# Patient Record
Sex: Male | Born: 1980 | Race: White | Hispanic: No | Marital: Single | Smoking: Current every day smoker
Health system: Southern US, Community
[De-identification: ages and names within clinical notes are randomized; demographics above are authoritative.]

---

## 2013-12-22 ENCOUNTER — Encounter (HOSPITAL_BASED_OUTPATIENT_CLINIC_OR_DEPARTMENT_OTHER): Payer: BC Managed Care – PPO

## 2014-01-05 ENCOUNTER — Encounter (HOSPITAL_BASED_OUTPATIENT_CLINIC_OR_DEPARTMENT_OTHER): Payer: BC Managed Care – PPO

## 2014-02-04 ENCOUNTER — Ambulatory Visit (HOSPITAL_BASED_OUTPATIENT_CLINIC_OR_DEPARTMENT_OTHER): Payer: BC Managed Care – PPO | Attending: Otolaryngology | Admitting: Sleep Medicine

## 2014-02-04 VITALS — Ht 61.2 in | Wt 168.0 lb

## 2014-02-04 DIAGNOSIS — G4710 Hypersomnia, unspecified: Secondary | ICD-10-CM | POA: Insufficient documentation

## 2014-02-04 DIAGNOSIS — G4730 Sleep apnea, unspecified: Secondary | ICD-10-CM

## 2014-02-07 DIAGNOSIS — G4730 Sleep apnea, unspecified: Secondary | ICD-10-CM

## 2014-02-07 NOTE — Sleep Study (Signed)
   NAME: Rickey GuileHugh Tashiro IV DATE OF BIRTH:  02/09/1981 MEDICAL RECORD NUMBER [id number]  LOCATION: [facility]  PHYSICIAN: Milo Solana D  DATE OF STUDY: 02/04/2014  SLEEP STUDY TYPE: Nocturnal Polysomnogram               REFERRING PHYSICIAN: Dillard CannonNewman, Christopher E, *  INDICATION FOR STUDY: Hypersomnia with sleep apnea  EPWORTH SLEEPINESS SCORE:   2/24 HEIGHT: 5' 1.2" (155.4 cm)  WEIGHT: 168 lb (76.204 kg)    Body mass index is 31.56 kg/(m^2).  NECK SIZE: 15 in.  MEDICATIONS: Charted for review  SLEEP ARCHITECTURE: Total sleep time 312.5 minutes with sleep efficiency 75.2%. Stage I was 6.4%, stage II 55.7%, stage III 22.2%, REM 15.7% of total sleep time. Sleep latency 76.5 minutes, REM latency 74.5 minutes, awake after sleep onset 26 minutes, arousal index 17.1. Bedtime medication: None  RESPIRATORY DATA: Apnea hypoxia index (AHI) 4.6 per hour. 24 total events scored including 7 obstructive apneas, 1 central apnea, 3 mixed apneas, 13 hypopneas. Most events were associated with supine sleep position. REM AHI of 4.9 per hour. There were not enough events to permit split protocol CPAP titration.  OXYGEN DATA: Loud snoring with oxygen desaturation to a nadir of 92% and mean oxygen saturation through the study of 96.4% on room air.  CARDIAC DATA: Sinus rhythm with occasional PAC  MOVEMENT/PARASOMNIA: No significant motor disturbance, no bathroom trips  IMPRESSION/ RECOMMENDATION:   1) Occasional respiratory events with sleep disturbance, within normal limits. AHI 4.6 per hour (the normal range for adults is an AHI from 0-5 events per hour). Loud snoring with oxygen desaturation to a nadir of 92% and mean oxygen saturation through the study of 96.4% on room air.   Signed Jetty Duhamellinton Ramone Gander M.D. Waymon BudgeYOUNG,Adaleen Hulgan D Diplomate, American Board of Sleep Medicine  ELECTRONICALLY SIGNED ON:  02/07/2014, 12:59 PM  [facility] PH: [phone]   FX: [phone]  [phone] ACCREDITED BY THE AMERICAN ACADEMY OF SLEEP MEDICINE

## 2014-09-22 ENCOUNTER — Emergency Department (HOSPITAL_COMMUNITY)
Admission: EM | Admit: 2014-09-22 | Discharge: 2014-09-22 | Discharge status: Absent without leave | Payer: BC Managed Care – PPO | Source: Home / Self Care

## 2014-10-02 ENCOUNTER — Encounter (HOSPITAL_COMMUNITY): Payer: Self-pay | Admitting: *Deleted

## 2014-10-02 ENCOUNTER — Emergency Department (HOSPITAL_COMMUNITY)
Admission: EM | Admit: 2014-10-02 | Discharge: 2014-10-02 | Disposition: A | Discharge status: Home / Self Care | Payer: BC Managed Care – PPO | Source: Home / Self Care | Attending: Emergency Medicine | Admitting: Emergency Medicine

## 2014-10-02 ENCOUNTER — Ambulatory Visit (HOSPITAL_COMMUNITY): Payer: BC Managed Care – PPO | Attending: Emergency Medicine

## 2014-10-02 DIAGNOSIS — R0602 Shortness of breath: Secondary | ICD-10-CM | POA: Diagnosis present

## 2014-10-02 DIAGNOSIS — R599 Enlarged lymph nodes, unspecified: Secondary | ICD-10-CM | POA: Insufficient documentation

## 2014-10-02 DIAGNOSIS — R590 Localized enlarged lymph nodes: Secondary | ICD-10-CM

## 2014-10-02 LAB — COMPREHENSIVE METABOLIC PANEL
ALBUMIN: 4.1 g/dL (ref 3.5–5.2)
ALK PHOS: 55 U/L (ref 39–117)
ALT: 25 U/L (ref 0–53)
ANION GAP: 12 (ref 5–15)
AST: 17 U/L (ref 0–37)
BUN: 10 mg/dL (ref 6–23)
CO2: 24 mEq/L (ref 19–32)
Calcium: 9 mg/dL (ref 8.4–10.5)
Chloride: 106 mEq/L (ref 96–112)
Creatinine, Ser: 0.68 mg/dL (ref 0.50–1.35)
GFR calc Af Amer: >90 mL/min (ref >90)
GFR calc non Af Amer: >90 mL/min (ref >90)
Glucose, Bld: 100 mg/dL — ABNORMAL HIGH (ref 70–99)
Potassium: 3.9 mEq/L (ref 3.7–5.3)
Sodium: 142 mEq/L (ref 137–147)
Total Bilirubin: 0.6 mg/dL (ref 0.3–1.2)
Total Protein: 6.5 g/dL (ref 6.0–8.3)

## 2014-10-02 LAB — CBC WITH DIFFERENTIAL/PLATELET
BASOS ABS: 0.1 10*3/uL (ref 0.0–0.1)
BASOS PCT: 1 % (ref 0–1)
EOS ABS: 0.1 10*3/uL (ref 0.0–0.7)
Eosinophils Relative: 2 % (ref 0–5)
HCT: 39.2 % (ref 39.0–52.0)
Hemoglobin: 13.6 g/dL (ref 13.0–17.0)
Lymphocytes Relative: 41 % (ref 12–46)
Lymphs Abs: 3 10*3/uL (ref 0.7–4.0)
MCH: 29.5 pg (ref 26.0–34.0)
MCHC: 34.7 g/dL (ref 30.0–36.0)
MCV: 85 fL (ref 78.0–100.0)
Monocytes Absolute: 0.5 10*3/uL (ref 0.1–1.0)
Monocytes Relative: 7 % (ref 3–12)
Neutro Abs: 3.7 10*3/uL (ref 1.7–7.7)
Neutrophils Relative %: 49 % (ref 43–77)
PLATELETS: 207 10*3/uL (ref 150–400)
RBC: 4.61 MIL/uL (ref 4.22–5.81)
RDW: 12.9 % (ref 11.5–15.5)
WBC: 7.3 10*3/uL (ref 4.0–10.5)

## 2014-10-02 LAB — POCT INFECTIOUS MONO SCREEN: Mono Screen: NEGATIVE

## 2014-10-02 MED ORDER — CETIRIZINE HCL 10 MG PO TABS: 10.0000 mg | ORAL_TABLET | Freq: Every day | ORAL | Status: AC

## 2014-10-02 NOTE — ED Notes (Signed)
Pt  Reports   sev  Months  Of   Swollen  Lymph  node throat       Reports   As  Well intermittant  Episodes of fatigue        And  Headaches     He is  Sitting  Upright on   Exam table appearing in no  Acute  distress

## 2014-10-02 NOTE — ED Provider Notes (Signed)
CSN: [id number]     Arrival date & time 10/02/14  0820 History   First MD Initiated Contact with Patient 10/02/14 [phone]     Chief Complaint  Patient presents with  . Lymphadenopathy   (Consider location/radiation/quality/duration/timing/severity/associated sxs/prior Treatment) HPI  He is a healthy 33 year old man here for evaluation of lymphadenopathy. He states since the end of September, he has had intermittently swollen lymph nodes in his neck. They are not particularly tender. The do fluctuate in size. He denies any runny nose or cough. He denies any history of allergies. He denies any sore throat, although does report some throat discomfort which he attributes to the size of the lymph nodes. He denies any fevers, chills, night sweats, weight loss. No nausea or vomiting. No abdominal pain. He does report some intermittent headaches, intermittent shortness of breath, intermittent ear stuffiness/pain. He does report some fatigue.  History reviewed. No pertinent past medical history. History reviewed. No pertinent past surgical history. No family history on file. History  Substance Use Topics  . Smoking status: Current Every Day Smoker  . Smokeless tobacco: Not on file  . Alcohol Use: No    Review of Systems  Constitutional: Positive for fatigue. Negative for fever, chills and unexpected weight change.  HENT: Positive for ear pain. Negative for congestion, rhinorrhea, sinus pressure, sore throat and trouble swallowing.   Respiratory: Positive for shortness of breath. Negative for cough.   Cardiovascular: Negative for chest pain.  Gastrointestinal: Negative for nausea, vomiting and abdominal pain.  Neurological: Positive for headaches. Negative for dizziness.  Hematological: Positive for adenopathy.    Allergies  Review of patient's allergies indicates no known allergies.  Home Medications   Prior to Admission medications   Medication Sig Start Date End Date Taking? Authorizing  Provider  cetirizine (ZYRTEC) 10 MG tablet Take 1 tablet (10 mg total) by mouth daily. 10/02/14   Charm RingsErin J Coyle Stordahl, MD   BP 121/73 mmHg  Pulse 50  Temp(Src) 98 F (36.7 C) (Oral)  Resp 18  SpO2 98% Physical Exam  Constitutional: He is oriented to person, place, and time. He appears well-developed and well-nourished. No distress.  Somewhat anxious appearing  HENT:  Head: Normocephalic and atraumatic.  Right Ear: External ear normal. Tympanic membrane is retracted (mild).  Left Ear: External ear normal. Tympanic membrane is retracted (mild).  Nose: Nose normal.  Mouth/Throat: Oropharynx is clear and moist. No oropharyngeal exudate.  Neck: Neck supple.  Cardiovascular: Normal rate, regular rhythm and normal heart sounds.   No murmur heard. Pulmonary/Chest: Effort normal and breath sounds normal. No respiratory distress. He has no wheezes. He has no rales.  Abdominal: Soft. Bowel sounds are normal. He exhibits no distension. There is no tenderness. There is no rebound and no guarding.  No hepatosplenomegaly  Lymphadenopathy:       Head (right side): Submandibular adenopathy present.       Head (left side): Submandibular adenopathy present.    He has no cervical adenopathy.    He has no axillary adenopathy.       Right: No supraclavicular adenopathy present.       Left: No supraclavicular adenopathy present.  Neurological: He is alert and oriented to person, place, and time.  Skin: Skin is warm and dry.    ED Course  Procedures (including critical care time) Labs Review Labs Reviewed  COMPREHENSIVE METABOLIC PANEL - Abnormal; Notable for the following:    Glucose, Bld 100 (*)    All other components within normal  limits  CBC WITH DIFFERENTIAL  POCT INFECTIOUS MONO SCREEN    Imaging Review Dg Chest 2 View  10/02/2014   CLINICAL DATA:  Shortness of breath.  Adenopathy in the neck.  EXAM: CHEST  2 VIEW  COMPARISON:  None.  FINDINGS: The heart size and pulmonary vascularity are  normal. There is slight blunting of the left costovertebral angle laterally which I suspect is chronic. The lungs are clear. No osseous abnormality.  IMPRESSION: Slight blunting of left costophrenic angle, most likely chronic. Otherwise, normal exam.   Electronically Signed   By: Geanie CooleyJim  Maxwell M.D.   On: 10/02/2014 09:55     MDM   1. Submandibular lymphadenopathy   2. Shortness of breath   3. Shortness of breath    He has some mild submandibular lymphadenopathy. He is CBC and CMP did not show any signs of lymphoma or other serious etiology. I suspect his lymphadenopathy is due to low-level virus exposure versus allergies versus CMV mono. Recommended treatment with daily Zyrtec. Discussed that lymph nodes can take months to resolve. Follow-up as needed.    Charm RingsErin J Johan Antonacci, MD 10/02/14 [phone]

## 2014-10-02 NOTE — Discharge Instructions (Signed)
Your lymph nodes are likely enlarge due to either allergies or low level virus exposures. Your blood work and chest x-ray were all normal. Take Zyrtec 1 pill daily for the next 3 months. Follow up if you develop fevers, night sweats, or weight loss.

## 2015-09-09 ENCOUNTER — Emergency Department (HOSPITAL_COMMUNITY)
Admission: EM | Admit: 2015-09-09 | Discharge: 2015-09-09 | Disposition: A | Discharge status: Home / Self Care | Payer: BLUE CROSS/BLUE SHIELD | Source: Home / Self Care | Attending: Emergency Medicine | Admitting: Emergency Medicine

## 2015-09-09 ENCOUNTER — Encounter (HOSPITAL_COMMUNITY): Payer: Self-pay | Admitting: Emergency Medicine

## 2015-09-09 DIAGNOSIS — J069 Acute upper respiratory infection, unspecified: Secondary | ICD-10-CM

## 2015-09-09 NOTE — ED Notes (Signed)
Cough and cold, onset Sunday 10/23

## 2015-09-09 NOTE — ED Provider Notes (Signed)
CSN: [id number]     Arrival date & time 09/09/15  1736 History   First MD Initiated Contact with Patient 09/09/15 1857     Chief Complaint  Patient presents with  . URI   (Consider location/radiation/quality/duration/timing/severity/associated sxs/prior Treatment) HPI Comments: 34 year old male states that 4 days ago he had a sore throat, headache and a feeling of 2 lymph nodes in his neck that were sore. Today he states his throat is more scratchy than anything. 2 days ago he states that he developed a runny nose, sneezing, nose stopped, chest tightness, cough, PND and ears feeling stopped up and uncomfortable. Sometimes he feels hot then cold.   History reviewed. No pertinent past medical history. History reviewed. No pertinent past surgical history. No family history on file. Social History  Substance Use Topics  . Smoking status: Current Every Day Smoker  . Smokeless tobacco: None  . Alcohol Use: No    Review of Systems  Constitutional: Positive for activity change. Negative for fever, diaphoresis and fatigue.  HENT: Positive for congestion, postnasal drip, sneezing, sore throat and trouble swallowing. Negative for ear pain, facial swelling and rhinorrhea.   Eyes: Negative for pain, discharge and redness.  Respiratory: Positive for cough. Negative for chest tightness and shortness of breath.   Cardiovascular: Negative.   Gastrointestinal: Negative.   Musculoskeletal: Negative.  Negative for neck pain and neck stiffness.  Neurological: Negative.     Allergies  Review of patient's allergies indicates no known allergies.  Home Medications   Prior to Admission medications   Medication Sig Start Date End Date Taking? Authorizing Provider  cetirizine (ZYRTEC) 10 MG tablet Take 1 tablet (10 mg total) by mouth daily. 10/02/14   Charm RingsErin J Honig, MD   Meds Ordered and Administered this Visit  Medications - No data to display  BP 112/63 mmHg  Pulse 63  Temp(Src) 98.6 F (37 C)  (Oral)  Resp 16  SpO2 99% No data found.   Physical Exam  Constitutional: He is oriented to person, place, and time. He appears well-developed and well-nourished. No distress.  HENT:  Bilateral TMs are retracted with minor erythema. No bulging or effusion is seen. Oropharynx with minor erythema, no swelling, exudates.  Eyes: EOM are normal.  Neck: Normal range of motion. Neck supple.  Cardiovascular: Normal rate, regular rhythm and normal heart sounds.   Pulmonary/Chest: Effort normal. No respiratory distress. He has wheezes.  Musculoskeletal: Normal range of motion. He exhibits no edema.  Lymphadenopathy:    He has no cervical adenopathy.  Neurological: He is alert and oriented to person, place, and time. He exhibits normal muscle tone.  Skin: Skin is warm and dry. No rash noted.  Psychiatric: He has a normal mood and affect.  Nursing note and vitals reviewed.   ED Course  Procedures (including critical care time)  Labs Review Labs Reviewed - No data to display  Imaging Review No results found.   Visual Acuity Review  Right Eye Distance:   Left Eye Distance:   Bilateral Distance:    Right Eye Near:   Left Eye Near:    Bilateral Near:         MDM   1. URI (upper respiratory infection)    Drink plenty of fluids and stay well-hydrated. Rest as much he can May take the over-the-counter medications we discussed that contained in any histamines and decongest. Dextromethorphan is good for cough. Cepacol lozenges for sore throat and ibuprofen for discomfort aches and pains or fever.  Hayden Rasmussen, NP 09/09/15 [phone]

## 2015-09-09 NOTE — Discharge Instructions (Signed)
Upper Respiratory Infection, Adult Drink plenty of fluids and stay well-hydrated. Rest as much he can May take the over-the-counter medications we discussed that contained in any histamines and decongest. Dextromethorphan is good for cough. Cepacol lozenges for sore throat and ibuprofen for discomfort aches and pains or fever. Most upper respiratory infections (URIs) are a viral infection of the air passages leading to the lungs. A URI affects the nose, throat, and upper air passages. The most common type of URI is nasopharyngitis and is typically referred to as "the common cold." URIs run their course and usually go away on their own. Most of the time, a URI does not require medical attention, but sometimes a bacterial infection in the upper airways can follow a viral infection. This is called a secondary infection. Sinus and middle ear infections are common types of secondary upper respiratory infections. Bacterial pneumonia can also complicate a URI. A URI can worsen asthma and chronic obstructive pulmonary disease (COPD). Sometimes, these complications can require emergency medical care and may be life threatening.  CAUSES Almost all URIs are caused by viruses. A virus is a type of germ and can spread from one person to another.  RISKS FACTORS You may be at risk for a URI if:   You smoke.   You have chronic heart or lung disease.  You have a weakened defense (immune) system.   You are very young or very old.   You have nasal allergies or asthma.  You work in crowded or poorly ventilated areas.  You work in health care facilities or schools. SIGNS AND SYMPTOMS  Symptoms typically develop 2-3 days after you come in contact with a cold virus. Most viral URIs last 7-10 days. However, viral URIs from the influenza virus (flu virus) can last 14-18 days and are typically more severe. Symptoms may include:   Runny or stuffy (congested) nose.   Sneezing.   Cough.   Sore throat.    Headache.   Fatigue.   Fever.   Loss of appetite.   Pain in your forehead, behind your eyes, and over your cheekbones (sinus pain).  Muscle aches.  DIAGNOSIS  Your health care provider may diagnose a URI by:  Physical exam.  Tests to check that your symptoms are not due to another condition such as:  Strep throat.  Sinusitis.  Pneumonia.  Asthma. TREATMENT  A URI goes away on its own with time. It cannot be cured with medicines, but medicines may be prescribed or recommended to relieve symptoms. Medicines may help:  Reduce your fever.  Reduce your cough.  Relieve nasal congestion. HOME CARE INSTRUCTIONS   Take medicines only as directed by your health care provider.   Gargle warm saltwater or take cough drops to comfort your throat as directed by your health care provider.  Use a warm mist humidifier or inhale steam from a shower to increase air moisture. This may make it easier to breathe.  Drink enough fluid to keep your urine clear or pale yellow.   Eat soups and other clear broths and maintain good nutrition.   Rest as needed.   Return to work when your temperature has returned to normal or as your health care provider advises. You may need to stay home longer to avoid infecting others. You can also use a face mask and careful hand washing to prevent spread of the virus.  Increase the usage of your inhaler if you have asthma.   Do not use any tobacco products,  including cigarettes, chewing tobacco, or electronic cigarettes. If you need help quitting, ask your health care provider. PREVENTION  The best way to protect yourself from getting a cold is to practice good hygiene.   Avoid oral or hand contact with people with cold symptoms.   Wash your hands often if contact occurs.  There is no clear evidence that vitamin C, vitamin E, echinacea, or exercise reduces the chance of developing a cold. However, it is always recommended to get plenty  of rest, exercise, and practice good nutrition.  SEEK MEDICAL CARE IF:   You are getting worse rather than better.   Your symptoms are not controlled by medicine.   You have chills.  You have worsening shortness of breath.  You have brown or red mucus.  You have yellow or brown nasal discharge.  You have pain in your face, especially when you bend forward.  You have a fever.  You have swollen neck glands.  You have pain while swallowing.  You have white areas in the back of your throat. SEEK IMMEDIATE MEDICAL CARE IF:   You have severe or persistent:  Headache.  Ear pain.  Sinus pain.  Chest pain.  You have chronic lung disease and any of the following:  Wheezing.  Prolonged cough.  Coughing up blood.  A change in your usual mucus.  You have a stiff neck.  You have changes in your:  Vision.  Hearing.  Thinking.  Mood. MAKE SURE YOU:   Understand these instructions.  Will watch your condition.  Will get help right away if you are not doing well or get worse.   This information is not intended to replace advice given to you by your health care provider. Make sure you discuss any questions you have with your health care provider.   Document Released: 04/25/2001 Document Revised: 03/16/2015 Document Reviewed: 02/04/2014 Elsevier Interactive Patient Education [employer]

## 2015-10-30 IMAGING — CR DG CHEST 2V
2 series · 2 of 2 positions shown · non-contrast
Comparison: None.

CLINICAL DATA: Shortness of breath.  Adenopathy in the neck.

EXAM:
CHEST  2 VIEW

[w chest pa]
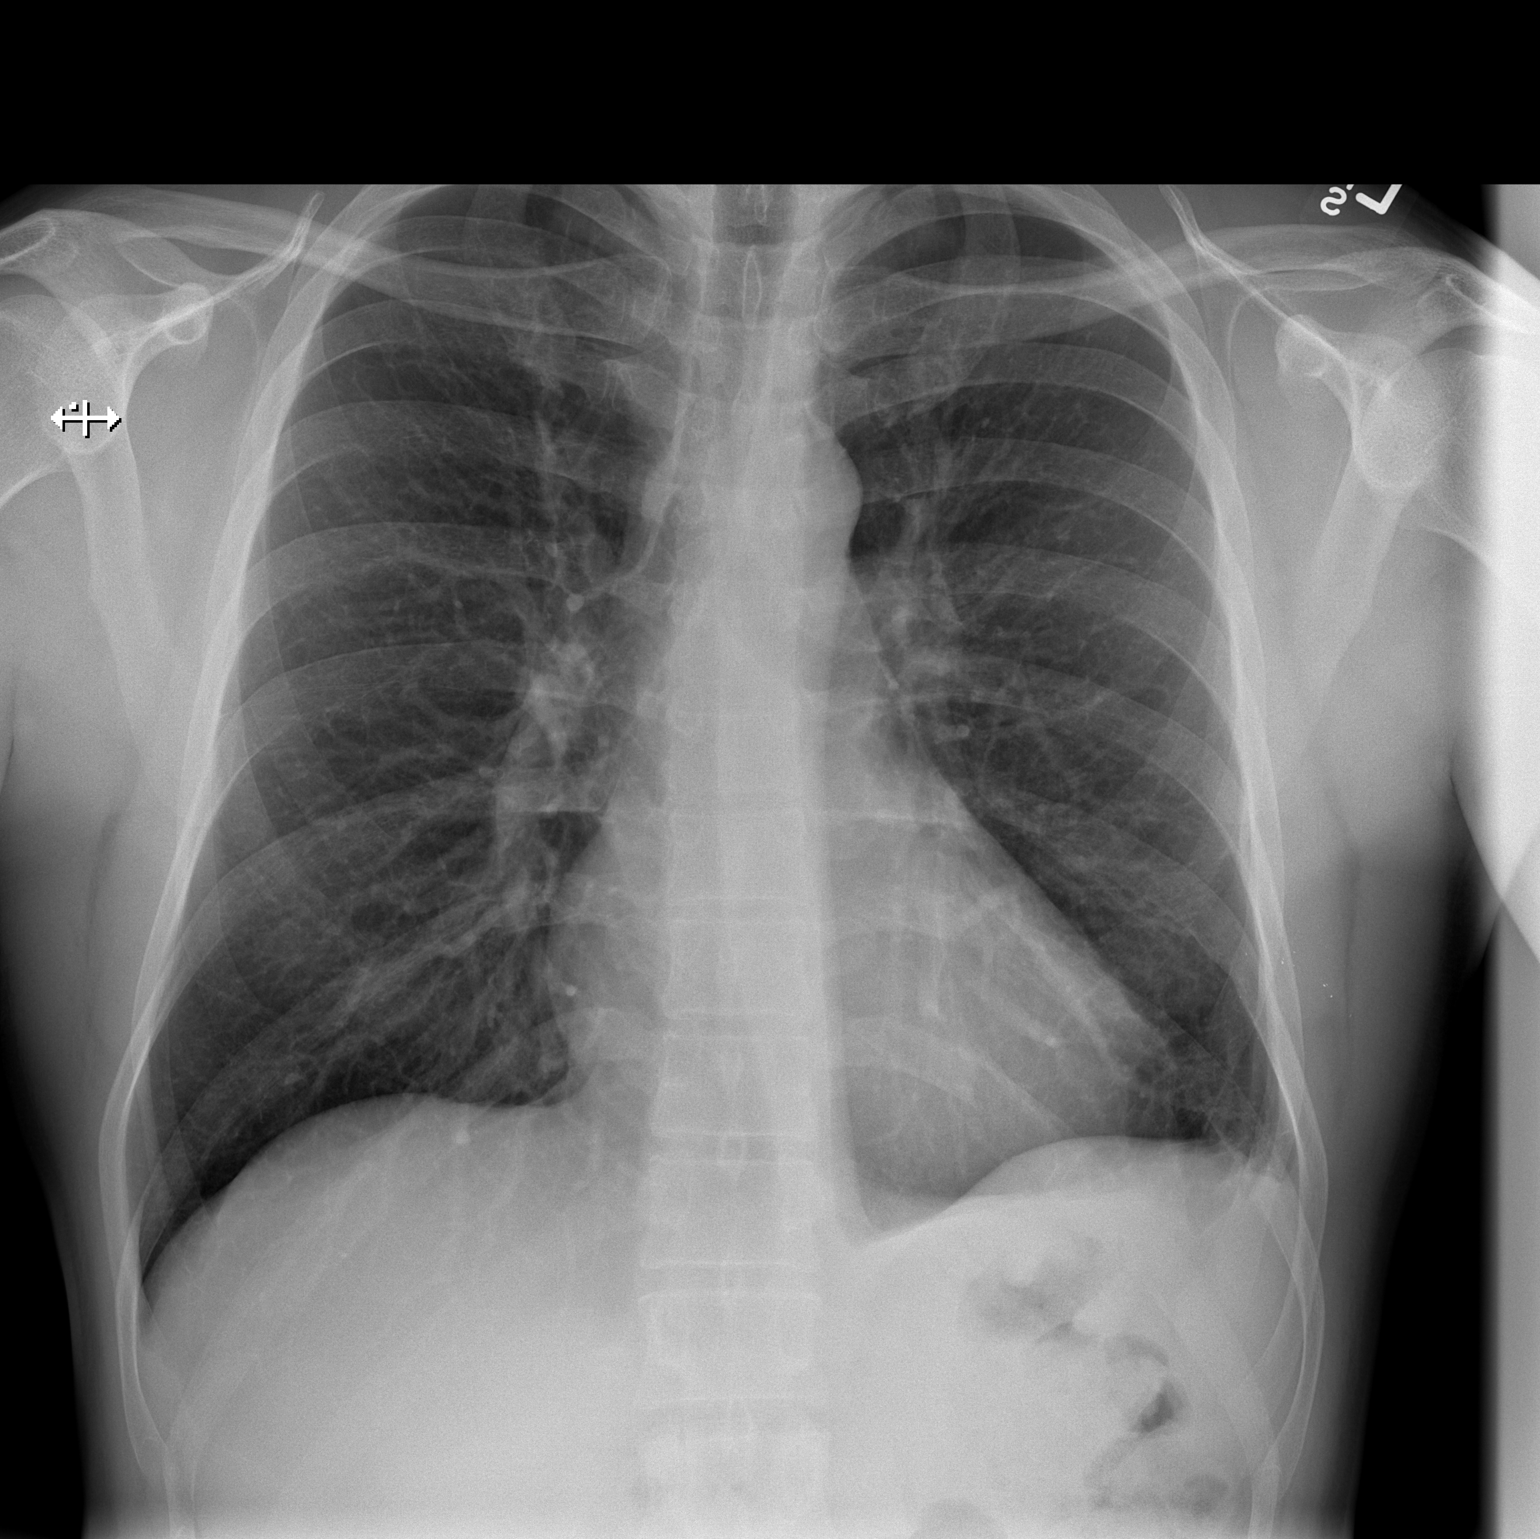

[w chest lat]
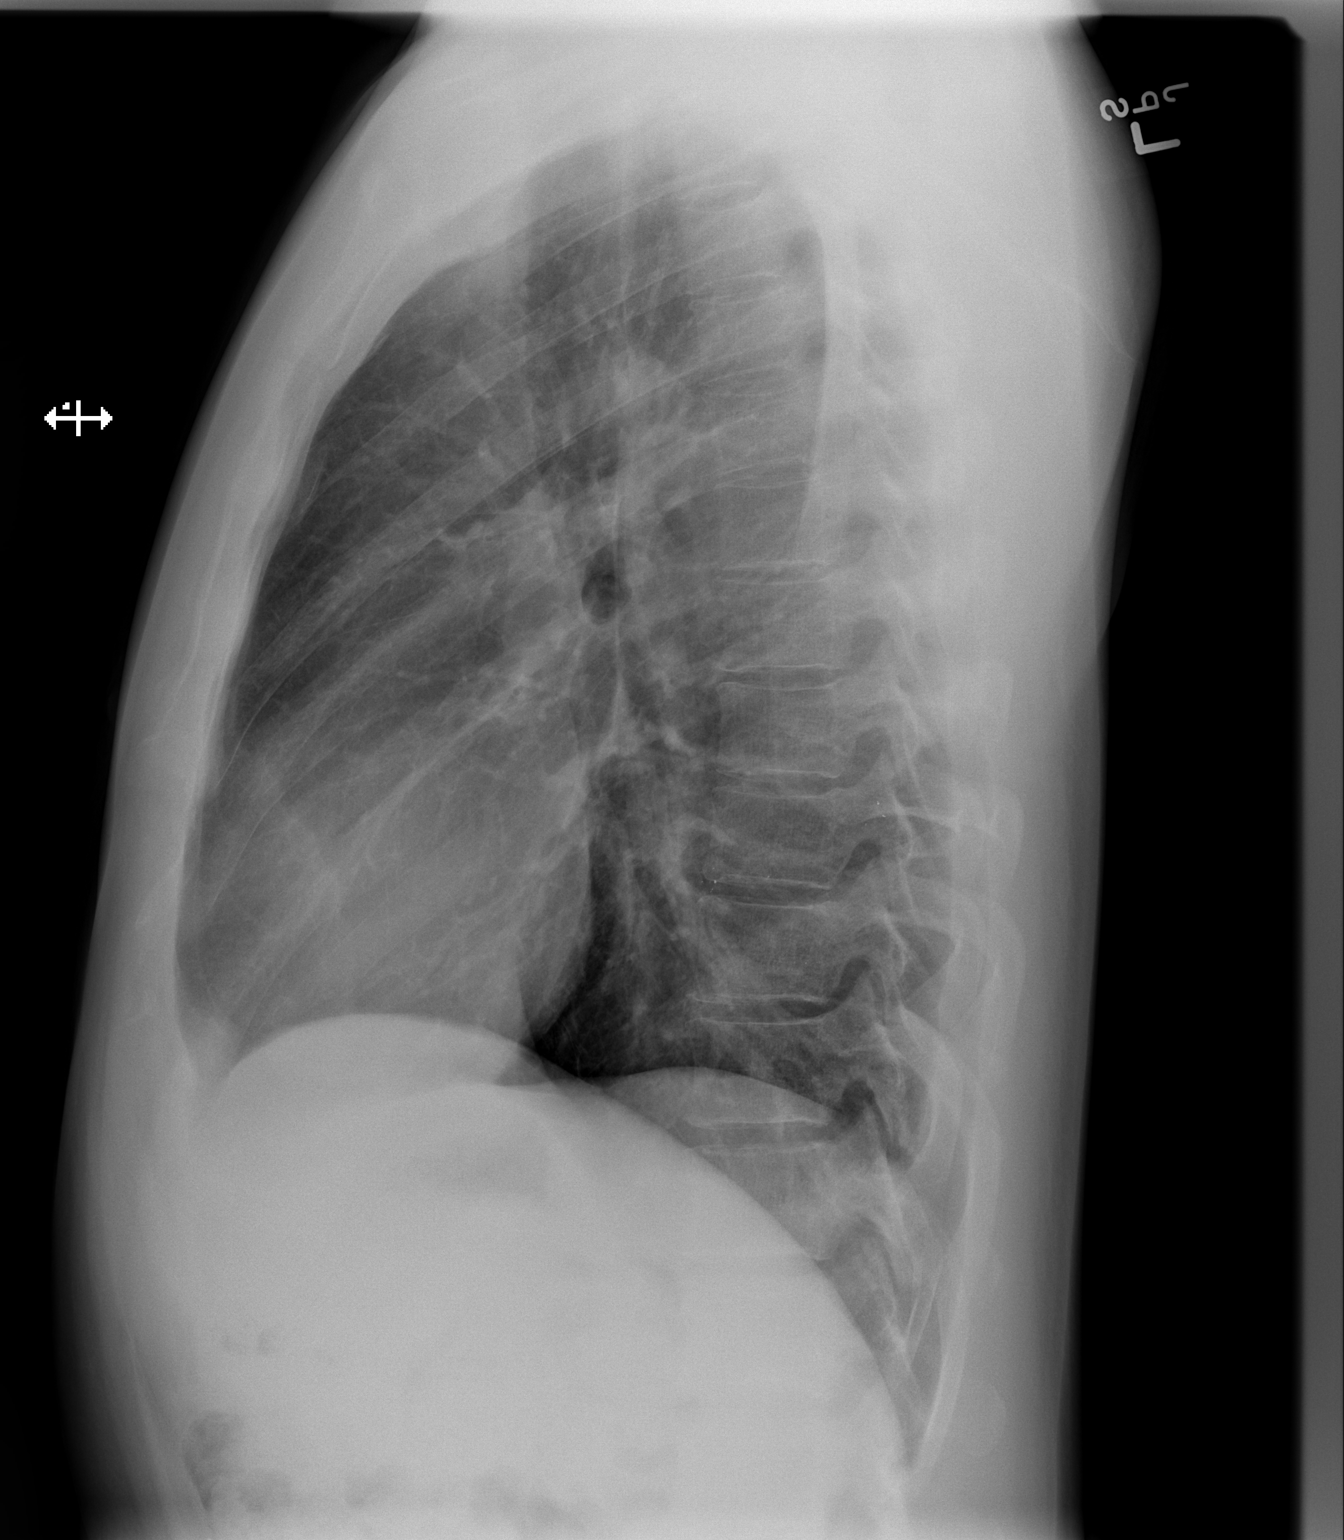

[2 of 2 positions shown; findings below may reference images not displayed]

FINDINGS: The heart size and pulmonary vascularity are normal. There is slight
blunting of the left costovertebral angle laterally which I suspect
is chronic. The lungs are clear. No osseous abnormality.
IMPRESSION: Slight blunting of left costophrenic angle, most likely chronic.
Otherwise, normal exam.
